# Patient Record
Sex: Female | Born: 1935 | Race: White | Hispanic: No | Smoking: Current every day smoker
Health system: Southern US, Community
[De-identification: ages and names within clinical notes are randomized; demographics above are authoritative.]

## PROBLEM LIST (undated history)

## (undated) DIAGNOSIS — C50919 Malignant neoplasm of unspecified site of unspecified female breast: Secondary | ICD-10-CM

## (undated) DIAGNOSIS — E785 Hyperlipidemia, unspecified: Secondary | ICD-10-CM

## (undated) DIAGNOSIS — K219 Gastro-esophageal reflux disease without esophagitis: Secondary | ICD-10-CM

## (undated) DIAGNOSIS — I10 Essential (primary) hypertension: Secondary | ICD-10-CM

## (undated) DIAGNOSIS — M810 Age-related osteoporosis without current pathological fracture: Secondary | ICD-10-CM

## (undated) DIAGNOSIS — I4891 Unspecified atrial fibrillation: Secondary | ICD-10-CM

## (undated) HISTORY — DX: Unspecified atrial fibrillation: I48.91

## (undated) HISTORY — DX: Essential (primary) hypertension: I10

## (undated) HISTORY — PX: BREAST LUMPECTOMY: SHX2

## (undated) HISTORY — DX: Hyperlipidemia, unspecified: E78.5

---

## 2004-11-24 ENCOUNTER — Ambulatory Visit: Payer: Self-pay | Admitting: Occupational Therapy

## 2005-12-20 ENCOUNTER — Ambulatory Visit: Payer: Self-pay | Admitting: Internal Medicine

## 2011-03-14 ENCOUNTER — Ambulatory Visit: Payer: Self-pay | Admitting: Oncology

## 2011-03-29 ENCOUNTER — Ambulatory Visit: Payer: Self-pay | Admitting: Oncology

## 2011-04-16 ENCOUNTER — Ambulatory Visit: Payer: Self-pay | Admitting: Oncology

## 2011-05-17 ENCOUNTER — Ambulatory Visit: Payer: Self-pay | Admitting: Oncology

## 2014-02-22 ENCOUNTER — Ambulatory Visit: Payer: Self-pay | Admitting: Family Medicine

## 2019-05-14 ENCOUNTER — Other Ambulatory Visit (HOSPITAL_COMMUNITY): Payer: Self-pay | Admitting: Internal Medicine

## 2019-05-14 DIAGNOSIS — N644 Mastodynia: Secondary | ICD-10-CM

## 2019-06-10 ENCOUNTER — Other Ambulatory Visit (HOSPITAL_COMMUNITY): Payer: Self-pay | Admitting: Internal Medicine

## 2019-06-15 ENCOUNTER — Other Ambulatory Visit (HOSPITAL_COMMUNITY): Payer: Self-pay | Admitting: Internal Medicine

## 2019-06-15 DIAGNOSIS — N644 Mastodynia: Secondary | ICD-10-CM

## 2019-06-23 ENCOUNTER — Ambulatory Visit (HOSPITAL_COMMUNITY)
Admission: RE | Admit: 2019-06-23 | Discharge: 2019-06-23 | Disposition: A | Discharge status: Home / Self Care | Payer: Medicare Other | Source: Ambulatory Visit | Attending: Internal Medicine | Admitting: Internal Medicine

## 2019-06-23 ENCOUNTER — Encounter (HOSPITAL_COMMUNITY): Payer: Self-pay

## 2019-06-23 ENCOUNTER — Ambulatory Visit (HOSPITAL_COMMUNITY): Payer: Medicare Other

## 2019-06-23 ENCOUNTER — Other Ambulatory Visit: Payer: Self-pay

## 2019-06-23 DIAGNOSIS — N644 Mastodynia: Secondary | ICD-10-CM | POA: Diagnosis not present

## 2019-09-14 HISTORY — PX: ESOPHAGOGASTRODUODENOSCOPY: SHX1529

## 2019-10-12 ENCOUNTER — Other Ambulatory Visit: Payer: Self-pay

## 2019-10-12 ENCOUNTER — Emergency Department (HOSPITAL_COMMUNITY): Payer: Medicare Other

## 2019-10-12 ENCOUNTER — Emergency Department (HOSPITAL_COMMUNITY)
Admission: EM | Admit: 2019-10-12 | Discharge: 2019-10-13 | Disposition: A | Discharge status: Skilled Nursing Facility | Payer: Medicare Other | Attending: Emergency Medicine | Admitting: Emergency Medicine

## 2019-10-12 ENCOUNTER — Encounter (HOSPITAL_COMMUNITY): Payer: Self-pay

## 2019-10-12 DIAGNOSIS — Z853 Personal history of malignant neoplasm of breast: Secondary | ICD-10-CM | POA: Diagnosis not present

## 2019-10-12 DIAGNOSIS — Z79899 Other long term (current) drug therapy: Secondary | ICD-10-CM | POA: Insufficient documentation

## 2019-10-12 DIAGNOSIS — E860 Dehydration: Secondary | ICD-10-CM | POA: Diagnosis not present

## 2019-10-12 DIAGNOSIS — K219 Gastro-esophageal reflux disease without esophagitis: Secondary | ICD-10-CM | POA: Diagnosis not present

## 2019-10-12 DIAGNOSIS — R630 Anorexia: Secondary | ICD-10-CM | POA: Diagnosis not present

## 2019-10-12 DIAGNOSIS — R531 Weakness: Secondary | ICD-10-CM | POA: Diagnosis present

## 2019-10-12 HISTORY — DX: Gastro-esophageal reflux disease without esophagitis: K21.9

## 2019-10-12 HISTORY — DX: Age-related osteoporosis without current pathological fracture: M81.0

## 2019-10-12 HISTORY — DX: Malignant neoplasm of unspecified site of unspecified female breast: C50.919

## 2019-10-12 LAB — CBC WITH DIFFERENTIAL/PLATELET
Abs Immature Granulocytes: 0.04 10*3/uL (ref 0.00–0.07)
Basophils Absolute: 0 10*3/uL (ref 0.0–0.1)
Basophils Relative: 0 %
Eosinophils Absolute: 0.1 10*3/uL (ref 0.0–0.5)
Eosinophils Relative: 1 %
HCT: 41.1 % (ref 36.0–46.0)
Hemoglobin: 12.2 g/dL (ref 12.0–15.0)
Immature Granulocytes: 0 %
Lymphocytes Relative: 19 %
Lymphs Abs: 1.8 10*3/uL (ref 0.7–4.0)
MCH: 32.5 pg (ref 26.0–34.0)
MCHC: 29.7 g/dL — ABNORMAL LOW (ref 30.0–36.0)
MCV: 109.6 fL — ABNORMAL HIGH (ref 80.0–100.0)
Monocytes Absolute: 0.8 10*3/uL (ref 0.1–1.0)
Monocytes Relative: 8 %
Neutro Abs: 6.7 10*3/uL (ref 1.7–7.7)
Neutrophils Relative %: 72 %
Platelets: 262 10*3/uL (ref 150–400)
RBC: 3.75 MIL/uL — ABNORMAL LOW (ref 3.87–5.11)
RDW: 13.2 % (ref 11.5–15.5)
WBC: 9.5 10*3/uL (ref 4.0–10.5)
nRBC: 0 % (ref 0.0–0.2)

## 2019-10-12 LAB — COMPREHENSIVE METABOLIC PANEL
ALT: 38 U/L (ref 0–44)
AST: 38 U/L (ref 15–41)
Albumin: 3.2 g/dL — ABNORMAL LOW (ref 3.5–5.0)
Alkaline Phosphatase: 74 U/L (ref 38–126)
Anion gap: 7 (ref 5–15)
BUN: 34 mg/dL — ABNORMAL HIGH (ref 8–23)
CO2: 29 mmol/L (ref 22–32)
Calcium: 9.2 mg/dL (ref 8.9–10.3)
Chloride: 108 mmol/L (ref 98–111)
Creatinine, Ser: 0.91 mg/dL (ref 0.44–1.00)
GFR calc Af Amer: >60 mL/min (ref >60)
GFR calc non Af Amer: 54 mL/min — ABNORMAL LOW (ref >60)
Glucose, Bld: 111 mg/dL — ABNORMAL HIGH (ref 70–99)
Potassium: 3.9 mmol/L (ref 3.5–5.1)
Sodium: 144 mmol/L (ref 135–145)
Total Bilirubin: 0.5 mg/dL (ref 0.3–1.2)
Total Protein: 6.7 g/dL (ref 6.5–8.1)

## 2019-10-12 MED ORDER — SODIUM CHLORIDE 0.9 % IV BOLUS
1000.0000 mL | Freq: Once | INTRAVENOUS | Status: AC
  Administered 2019-10-12: 1000 mL via INTRAVENOUS

## 2019-10-12 NOTE — ED Provider Notes (Signed)
[facility] EMERGENCY DEPARTMENT Provider Note   CSN: [place]:[id number] Arrival date & time: 10/12/19  1628     History Chief Complaint  Patient presents with  . Failure To Thrive    Gloria Davis is a 86 y.o. female.  Patient fell last week and was taken over to be seen at the [facility] emergency department.  She was discharged home.  Patient has continued to be weak and was sent here for recheck.  Patient stays at the assisted living and has not been walking since her fall but is alert and oriented to person and place  The history is provided by the patient and a relative. No language interpreter was used.  Weakness Severity:  Mild Onset quality:  Sudden Timing:  Constant Progression:  Waxing and waning Chronicity:  New Context: not alcohol use   Relieved by:  Nothing Worsened by:  Nothing Ineffective treatments:  None tried Associated symptoms: no abdominal pain, no chest pain, no cough, no diarrhea, no frequency, no headaches and no seizures        Past Medical History:  Diagnosis Date  . GERD (gastroesophageal reflux disease)   . Malignant neoplasm of breast (female) ([place])   . Osteoporosis     There are no problems to display for this patient.   Past Surgical History:  Procedure Laterality Date  . BREAST LUMPECTOMY       OB History   No obstetric history on file.     No family history on file.  Social History   Tobacco Use  . Smoking status: Not on file  Substance Use Topics  . Alcohol use: Not on file  . Drug use: Not on file    Home Medications Prior to Admission medications   Medication Sig Start Date End Date Taking? Authorizing Provider  acetaminophen (TYLENOL) 325 MG tablet Take 650 mg by mouth in the morning and at bedtime.   Yes [provider]  alum & mag hydroxide-simeth ([place]) 200-200-20 MG/5ML suspension Take by mouth every 6 (six) hours as needed for indigestion or heartburn.   Yes [provider]  amLODipine  (NORVASC) 5 MG tablet Take 5 mg by mouth daily.   Yes [provider]  apixaban (ELIQUIS) 2.5 MG TABS tablet Take 2.5 mg by mouth 2 (two) times daily.   Yes [provider]  aspirin EC 81 MG tablet Take 81 mg by mouth daily.   Yes [provider]  cholecalciferol (VITAMIN D3) 25 MCG (1000 UNIT) tablet Take 1,000 Units by mouth daily.   Yes [provider]  docusate sodium (COLACE) 100 MG capsule Take 100 mg by mouth 2 (two) times daily.   Yes [provider]  doxycycline (VIBRAMYCIN) 100 MG capsule Take 100 mg by mouth 2 (two) times daily.   Yes [provider]  ferrous sulfate 325 (65 FE) MG EC tablet Take 325 mg by mouth daily.   Yes [provider]  latanoprost (XALATAN) 0.005 % ophthalmic solution Place 1 drop into both eyes daily.   Yes [provider]  loperamide (IMODIUM A-D) 2 MG tablet Take 2 mg by mouth 4 (four) times daily as needed for diarrhea or loose stools.   Yes [provider]  magnesium hydroxide (MILK OF MAGNESIA) 400 MG/5ML suspension Take 30 mLs by mouth at bedtime as needed for mild constipation.   Yes [provider]  metoprolol succinate (TOPROL-XL) 50 MG 24 hr tablet Take 50 mg by mouth in the morning and  at bedtime. Take with or immediately following a meal.   Yes [provider]  ondansetron (ZOFRAN-ODT) 4 MG disintegrating tablet Take 4 mg by mouth every 6 (six) hours as needed for nausea or vomiting.   Yes [provider]  pantoprazole (PROTONIX) 40 MG tablet Take 40 mg by mouth 2 (two) times daily.   Yes [provider]  Polyethyl Glycol-Propyl Glycol (SYSTANE) 0.4-0.3 % GEL ophthalmic gel Place 1 application into both eyes in the morning, at noon, and at bedtime.   Yes [provider]  rosuvastatin (CRESTOR) 10 MG tablet Take 10 mg by mouth at bedtime.   Yes [provider]  senna-docusate (SENEXON-S) 8.6-50 MG tablet Take 1 tablet by  mouth 2 (two) times daily.   Yes [provider]    Allergies    Codeine, Lactose intolerance (gi), and Sulfa antibiotics  Review of Systems   Review of Systems  Constitutional: Negative for appetite change and fatigue.  HENT: Negative for congestion, ear discharge and sinus pressure.   Eyes: Negative for discharge.  Respiratory: Negative for cough.   Cardiovascular: Negative for chest pain.  Gastrointestinal: Negative for abdominal pain and diarrhea.  Genitourinary: Negative for frequency and hematuria.  Musculoskeletal: Negative for back pain.  Skin: Negative for rash.  Neurological: Positive for weakness. Negative for seizures and headaches.  Psychiatric/Behavioral: Negative for hallucinations.    Physical Exam Updated Vital Signs BP (!) 147/59   Pulse (!) 58   Temp 98.6 F (37 C) (Oral)   Resp 17   Ht 5' (1.524 m)   Wt 40.8 kg   SpO2 93%   BMI 17.58 kg/m   Physical Exam Vitals reviewed.  Constitutional:      Appearance: She is well-developed.  HENT:     Head: Normocephalic.     Comments: Mucous membranes dry    Nose: Nose normal.  Eyes:     General: No scleral icterus.    Conjunctiva/sclera: Conjunctivae normal.  Neck:     Thyroid: No thyromegaly.  Cardiovascular:     Rate and Rhythm: Normal rate and regular rhythm.     Heart sounds: No murmur. No friction rub. No gallop.   Pulmonary:     Breath sounds: No stridor. No wheezing or rales.  Chest:     Chest wall: No tenderness.  Abdominal:     General: There is no distension.     Tenderness: There is no abdominal tenderness. There is no rebound.  Musculoskeletal:        General: Normal range of motion.     Cervical back: Neck supple.  Lymphadenopathy:     Cervical: No cervical adenopathy.  Skin:    Findings: No erythema or rash.  Neurological:     Mental Status: She is alert and oriented to person, place, and time.     Motor: No abnormal muscle tone.     Coordination: Coordination normal.    Psychiatric:        Behavior: Behavior normal.     ED Results / Procedures / Treatments   Labs (all labs ordered are listed, but only abnormal results are displayed) Labs Reviewed  CBC WITH DIFFERENTIAL/PLATELET - Abnormal; Notable for the following components:      Result Value   RBC 3.75 (*)    MCV 109.6 (*)    MCHC 29.7 (*)    All other components within normal limits  COMPREHENSIVE METABOLIC PANEL - Abnormal; Notable for the following components:   Glucose, Bld 111 (*)  BUN 34 (*)    Albumin 3.2 (*)    GFR calc non Af Amer 54 (*)    All other components within normal limits  URINALYSIS, ROUTINE W REFLEX MICROSCOPIC    EKG EKG Interpretation  Date/Time:  Monday October 12 2019 17:56:58 EDT Ventricular Rate:  56 PR Interval:    QRS Duration: 82 QT Interval:  466 QTC Calculation: 450 R Axis:   -36 Text Interpretation: Sinus rhythm Left axis deviation Anteroseptal infarct, age indeterminate Confirmed by Milton Ferguson [id number]) on 10/12/2019 11:31:40 PM   Radiology DG Chest 2 View  Result Date: 10/12/2019 CLINICAL DATA:  Low-grade fever, anorexia EXAM: CHEST - 2 VIEW COMPARISON:  None. FINDINGS: Frontal and lateral views of the chest demonstrate an unremarkable cardiac silhouette. Diffuse interstitial prominence favor scarring. No airspace disease, effusion, or pneumothorax. Multiple compression deformities throughout the lower thoracic and lumbar spine, with multilevel vertebral augmentation and posterior fusion. IMPRESSION: 1. No acute intrathoracic process. Electronically Signed   By: Randa Ngo M.D.   On: 10/12/2019 19:02   CT Head Wo Contrast  Result Date: 10/12/2019 CLINICAL DATA:  Ataxia, anorexia for 1 week, low-grade fever EXAM: CT HEAD WITHOUT CONTRAST TECHNIQUE: Contiguous axial images were obtained from the base of the skull through the vertex without intravenous contrast. COMPARISON:  None. FINDINGS: Brain: There are confluent hypodensities within the  periventricular white matter consistent with age-indeterminate small vessel ischemic changes. There is diffuse cerebral atrophy, age-appropriate. No signs of acute infarct or hemorrhage. Lateral ventricles and midline structures are unremarkable. No acute extra-axial fluid collections. No mass effect. Vascular: No hyperdense vessel or unexpected calcification. Skull: Normal. Negative for fracture or focal lesion. Sinuses/Orbits: No acute finding. Other: None IMPRESSION: 1. Age indeterminate small-vessel ischemic changes throughout the periventricular white matter, favor chronic. 2. No acute intracranial process. Electronically Signed   By: Randa Ngo M.D.   On: 10/12/2019 19:01    Procedures Procedures (including critical care time)  Medications Ordered in ED Medications  sodium chloride 0.9 % bolus 1,000 mL (0 mLs Intravenous Stopped 10/12/19 2321)    ED Course  I have reviewed the triage vital signs and the nursing notes.  Pertinent labs & imaging results that were available during my care of the patient were reviewed by me and considered in my medical decision making (see chart for details).    MDM Rules/Calculators/A&P                      Labs show patient to be dehydrated, chest x-ray and CT unremarkable.  Patient was given 1 L of fluids and was able to urinate for [place]. Final Clinical Impression(s) / ED Diagnoses Final diagnoses:  None    Rx / DC Orders ED Discharge Orders    None       Milton Ferguson, MD 10/13/19 2308

## 2019-10-12 NOTE — Discharge Instructions (Addendum)
Drink more fluids and follow-up with your family doctor later this week for recheck.  Return to the ED if you develop new or worsening symptoms.

## 2019-10-12 NOTE — ED Triage Notes (Signed)
Pt brought to ED via [facility]. Pt recently transported on 3/25 to [facility] for same complaints and sent here for second opinion per EMS. Per [facility], pt has had low grade fever, not eating x 1 week.

## 2019-10-12 NOTE — ED Notes (Signed)
Unsuccessful in and out x 2.

## 2019-10-13 DIAGNOSIS — E860 Dehydration: Secondary | ICD-10-CM | POA: Diagnosis not present

## 2019-10-13 LAB — URINALYSIS, ROUTINE W REFLEX MICROSCOPIC
Bilirubin Urine: NEGATIVE
Glucose, UA: NEGATIVE mg/dL
Hgb urine dipstick: NEGATIVE
Ketones, ur: NEGATIVE mg/dL
Nitrite: NEGATIVE
Protein, ur: NEGATIVE mg/dL
Specific Gravity, Urine: 1.02 (ref 1.005–1.030)
pH: 5 (ref 5.0–8.0)

## 2019-10-13 NOTE — ED Provider Notes (Signed)
Care assumed from Dr. Roderic Palau pending urinalysis.  Patient with generalized weakness and anorexia.  Denies any pain complaints.  She received IV fluids.  Urinalysis is not overtly infected.  Will send culture.  Patient is able to ambulate which is her baseline with assistance. Discussed with patient and daughter at bedside. Patient denies complaints and is anxious to go home. Follow-up with PCP.  Return precautions discussed.  BP (!) 144/68   Pulse (!) 59   Temp 98.6 F (37 C) (Oral)   Resp 15   Ht 5' (1.524 m)   Wt 40.8 kg   SpO2 96%   BMI 17.58 kg/m     Ezequiel Essex, MD 10/13/19 0345

## 2019-10-13 NOTE — ED Notes (Signed)
Pt being d/c'd into niece's car to go POV to [facility]. This RN spoke with [facility] who stated that it was okay for pt to be transported back by niece.

## 2019-10-14 LAB — URINE CULTURE: Culture: NO GROWTH

## 2019-10-28 ENCOUNTER — Other Ambulatory Visit: Payer: Self-pay

## 2019-10-28 ENCOUNTER — Ambulatory Visit (INDEPENDENT_AMBULATORY_CARE_PROVIDER_SITE_OTHER): Payer: Medicare Other | Admitting: Gastroenterology

## 2019-10-28 ENCOUNTER — Encounter: Payer: Self-pay | Admitting: Gastroenterology

## 2019-10-28 DIAGNOSIS — K2101 Gastro-esophageal reflux disease with esophagitis, with bleeding: Secondary | ICD-10-CM

## 2019-10-28 DIAGNOSIS — R1310 Dysphagia, unspecified: Secondary | ICD-10-CM | POA: Diagnosis not present

## 2019-10-28 DIAGNOSIS — K219 Gastro-esophageal reflux disease without esophagitis: Secondary | ICD-10-CM | POA: Insufficient documentation

## 2019-10-28 NOTE — Progress Notes (Addendum)
Primary Care Physician:  Abran Richard, MD  Primary Gastroenterologist:  Garfield Cornea, MD   Chief Complaint  Patient presents with  . Gastroesophageal Reflux    esophagitis  . Dysphagia    anything she has to swallow    HPI:  Gloria Davis is a 86 y.o. female here at the request of Abran Richard for further evaluation of esophagitis, dysphagia. Patient resides at [facility]. Presents with the Financial risk analyst today who is helping with transportation. She is really unable to provide additional information about the patient. Reportedly in hospital recently in [place] and noted to have esophagitis on endoscopy and CT scan.   Per patient, she has been having trouble swallowing for quite some time. Chews thoroughly. Last one to finish eating. No significant weight loss reported. It is not clear if her swallowing issues are significant. She says her stomach feels tight with eating. No heartburn or vomiting. She says her stools were "black as tar" and runny before, but now lighter and formed. No black stools in several weeks. No constipation, diarrhea.    Current Outpatient Medications  Medication Sig Dispense Refill  . acetaminophen (TYLENOL) 325 MG tablet Take 325 mg by mouth in the morning and at bedtime.     Marland Kitchen acetaminophen (TYLENOL) 500 MG tablet Take 500 mg by mouth every 4 (four) hours as needed.    Marland Kitchen alum & mag hydroxide-simeth ([place]) 200-200-20 MG/5ML suspension Take 30 mLs by mouth as needed for indigestion or heartburn.     Marland Kitchen amLODipine (NORVASC) 5 MG tablet Take 5 mg by mouth daily.    Marland Kitchen apixaban (ELIQUIS) 2.5 MG TABS tablet Take 2.5 mg by mouth 2 (two) times daily.    Marland Kitchen aspirin EC 81 MG tablet Take 81 mg by mouth daily.    . cholecalciferol (VITAMIN D3) 25 MCG (1000 UNIT) tablet Take 1,000 Units by mouth daily.    Marland Kitchen docusate sodium (COLACE) 100 MG capsule Take 100 mg by mouth 2 (two) times daily.    Marland Kitchen doxycycline (VIBRAMYCIN) 100 MG capsule Take 100 mg by mouth 2 (two)  times daily.    . ferrous sulfate 325 (65 FE) MG EC tablet Take 325 mg by mouth daily.    . food thickener (THICK IT) POWD Take by mouth in the morning and at bedtime.    Marland Kitchen guaifenesin (ROBITUSSIN) 100 MG/5ML syrup Take 10 mLs by mouth every 6 (six) hours as needed for cough.    . latanoprost (XALATAN) 0.005 % ophthalmic solution Place 1 drop into both eyes daily.    Marland Kitchen loperamide (IMODIUM A-D) 2 MG tablet Take 2 mg by mouth 4 (four) times daily as needed for diarrhea or loose stools.    . magnesium hydroxide (MILK OF MAGNESIA) 400 MG/5ML suspension Take 30 mLs by mouth at bedtime as needed for mild constipation.    . metoprolol succinate (TOPROL-XL) 50 MG 24 hr tablet Take 50 mg by mouth in the morning and at bedtime. Take with or immediately following a meal.    . neomycin-bacitracin-polymyxin (NEOSPORIN) [phone] ointment Apply topically as needed.    . ondansetron (ZOFRAN-ODT) 4 MG disintegrating tablet Take 4 mg by mouth every 6 (six) hours as needed for nausea or vomiting.    . pantoprazole (PROTONIX) 40 MG tablet Take 40 mg by mouth 2 (two) times daily.    Vladimir Faster Glycol-Propyl Glycol (SYSTANE) 0.4-0.3 % GEL ophthalmic gel Place 1 application into both eyes 2 (two) times daily as needed.     Marland Kitchen  rosuvastatin (CRESTOR) 10 MG tablet Take 10 mg by mouth at bedtime.    . senna-docusate (SENEXON-S) 8.6-50 MG tablet Take 2 tablets by mouth at bedtime.      No current facility-administered medications for this visit.    Allergies as of 10/28/2019 - Review Complete 10/28/2019  Allergen Reaction Noted  . Codeine  10/12/2019  . Lactose intolerance (gi)  10/12/2019  . Sulfa antibiotics  10/12/2019    Past Medical History:  Diagnosis Date  . A-fib ([place])   . GERD (gastroesophageal reflux disease)   . HTN (hypertension)   . Hyperlipidemia   . Malignant neoplasm of breast (female) ([place])   . Osteoporosis     Past Surgical History:  Procedure Laterality Date  . BREAST LUMPECTOMY    .  ESOPHAGOGASTRODUODENOSCOPY  09/2019   Dr. Posey Pronto: moderate erythema stomach body and antrum, 4cm hh, moderate to severe friability and nodularity with small clots in the GEJ and lower 3rd of esophagus, ulcers milddle to lower third of esophagus. extrinsix deformity duodenal bulb unable to advance scopt to second part ?prior PUD. Bx chronic gastritis, ulcerative reflux esophagitis. no h.pylori or barretts    History reviewed. No pertinent family history.  Social History   Socioeconomic History  . Marital status: Widowed    Spouse name: Not on file  . Number of children: Not on file  . Years of education: Not on file  . Highest education level: Not on file  Occupational History  . Not on file  Tobacco Use  . Smoking status: Current Every Day Smoker    Types: Cigarettes  . Smokeless tobacco: Never Used  Substance and Sexual Activity  . Alcohol use: Never  . Drug use: Never  . Sexual activity: Not on file  Other Topics Concern  . Not on file  Social History Narrative  . Not on file   Social Determinants of Health   Financial Resource Strain:   . Difficulty of Paying Living Expenses:   Food Insecurity:   . Worried About Charity fundraiser in the Last Year:   . Arboriculturist in the Last Year:   Transportation Needs:   . Film/video editor (Medical):   Marland Kitchen Lack of Transportation (Non-Medical):   Physical Activity:   . Days of Exercise per Week:   . Minutes of Exercise per Session:   Stress:   . Feeling of Stress :   Social Connections:   . Frequency of Communication with Friends and Family:   . Frequency of Social Gatherings with Friends and Family:   . Attends Religious Services:   . Active Member of Clubs or Organizations:   . Attends Archivist Meetings:   Marland Kitchen Marital Status:   Intimate Partner Violence:   . Fear of Current or Ex-Partner:   . Emotionally Abused:   Marland Kitchen Physically Abused:   . Sexually Abused:       ROS: limited information by  patient  General: Negative for anorexia, weight loss, fever, chills, fatigue, weakness. Eyes: Negative for vision changes.  ENT: Negative for hoarseness,  nasal congestion.see hpi CV: Negative for chest pain, angina, palpitations, dyspnea on exertion, peripheral edema.  Respiratory: Negative for dyspnea at rest, dyspnea on exertion, cough, sputum, wheezing.  GI: See history of present illness. GU:  Negative for dysuria, hematuria, urinary incontinence, urinary frequency, nocturnal urination.  MS: Negative for joint pain, low back pain.  Derm: Negative for rash or itching.  Neuro: Negative for weakness, abnormal sensation,  seizure, frequent headaches, memory loss, confusion.  Psych: Negative for anxiety, depression, suicidal ideation, hallucinations.  Endo: Negative for unusual weight change.  Heme: Negative for bruising or bleeding. Allergy: Negative for rash or hives.    Physical Examination:  BP 137/67   Pulse (!) 52   Temp (!) 97.3 F (36.3 C) (Oral)   Wt 96 lb 6.4 oz (43.7 kg)   BMI 18.83 kg/m    General: Well-nourished, well-developed in no acute distress.  Head: Normocephalic, atraumatic.   Eyes: Conjunctiva pink, no icterus. Mouth: masked Neck: Supple without thyromegaly, masses, or lymphadenopathy.  Lungs: Clear to auscultation bilaterally.  Heart: Regular rate and rhythm, no murmurs rubs or gallops.  Abdomen: Bowel sounds are normal, nontender, nondistended, no hepatosplenomegaly or masses, no abdominal bruits or    hernia , no rebound or guarding.   Rectal: not performed Extremities: No lower extremity edema. No clubbing or deformities.  Neuro: Alert and oriented x 4 , grossly normal neurologically.  Skin: Warm and dry, no rash or jaundice.   Psych: Alert and cooperative, normal mood and affect.  Lab Results  Component Value Date   WBC 9.5 10/12/2019   HGB 12.2 10/12/2019   HCT 41.1 10/12/2019   MCV 109.6 (H) 10/12/2019   PLT 262 10/12/2019   Lab Results   Component Value Date   CREATININE 0.91 10/12/2019   BUN 34 (H) 10/12/2019   NA 144 10/12/2019   K 3.9 10/12/2019   CL 108 10/12/2019   CO2 29 10/12/2019   Lab Results  Component Value Date   ALT 38 10/12/2019   AST 38 10/12/2019   ALKPHOS 74 10/12/2019   BILITOT 0.5 10/12/2019    Imaging Studies: DG Chest 2 View  Result Date: 10/12/2019 CLINICAL DATA:  Low-grade fever, anorexia EXAM: CHEST - 2 VIEW COMPARISON:  None. FINDINGS: Frontal and lateral views of the chest demonstrate an unremarkable cardiac silhouette. Diffuse interstitial prominence favor scarring. No airspace disease, effusion, or pneumothorax. Multiple compression deformities throughout the lower thoracic and lumbar spine, with multilevel vertebral augmentation and posterior fusion. IMPRESSION: 1. No acute intrathoracic process. Electronically Signed   By: Randa Ngo M.D.   On: 10/12/2019 19:02   CT Head Wo Contrast  Result Date: 10/12/2019 CLINICAL DATA:  Ataxia, anorexia for 1 week, low-grade fever EXAM: CT HEAD WITHOUT CONTRAST TECHNIQUE: Contiguous axial images were obtained from the base of the skull through the vertex without intravenous contrast. COMPARISON:  None. FINDINGS: Brain: There are confluent hypodensities within the periventricular white matter consistent with age-indeterminate small vessel ischemic changes. There is diffuse cerebral atrophy, age-appropriate. No signs of acute infarct or hemorrhage. Lateral ventricles and midline structures are unremarkable. No acute extra-axial fluid collections. No mass effect. Vascular: No hyperdense vessel or unexpected calcification. Skull: Normal. Negative for fracture or focal lesion. Sinuses/Orbits: No acute finding. Other: None IMPRESSION: 1. Age indeterminate small-vessel ischemic changes throughout the periventricular white matter, favor chronic. 2. No acute intracranial process. Electronically Signed   By: Randa Ngo M.D.   On: 10/12/2019 19:01

## 2019-10-28 NOTE — Patient Instructions (Signed)
1. We are working on getting your records from your upper endoscopy and CT scan done while you were in the hospital. Once reviewed, we will provide additional recommendations.

## 2019-11-03 ENCOUNTER — Encounter: Payer: Self-pay | Admitting: Gastroenterology

## 2019-11-03 NOTE — Assessment & Plan Note (Signed)
86 y/o female presenting from [facility] for further evaluation of dysphagia and esophagitis. Reportedly had EGD and CT scan during admission in [place] recently. Records unavailable. Patient unable to provide significant history. Reports feeling full with meals and taking long time to chew food and eat.   Will obtain records and then touch base with [facility] to get more information regarding any questions or concerns they have regarding eating etc.

## 2019-11-04 ENCOUNTER — Telehealth: Payer: Self-pay | Admitting: Gastroenterology

## 2019-11-04 ENCOUNTER — Encounter: Payer: Self-pay | Admitting: Gastroenterology

## 2019-11-04 NOTE — Telephone Encounter (Signed)
Left a message for pts nurse. Pts nurse has left for the day and will call back tomorrow 11/05/19.

## 2019-11-04 NOTE — Telephone Encounter (Signed)
Please contact [place] and speak to someone who can provide information about the patient. She presented last week for new patient evaluation for esophagitis and dysphagia.   I have looked at records from her hospitalization at [facility].   EGD showed severe ulcerative/erosive reflux esophagitis, gastritis, large hh, abnormal duodenal bulb (could not advise scope).   Prior CT A/P with no acute findings or signs of malignancy.  WE NEED TO HAVE CBC DRAWN ON PATIENT  WE NEED TO KNOW IF SHE IS HAVING ANY ISSUES WITH HER MEALS, SWALLOWING, HEARTBURN/CHEST PAIN.  CONTINUE PANTOPRAZOLE 40MG  BID.   DOES SHE HAVE SPECIAL DIET OR HAVE TO THICKEN LIQUIDS?

## 2019-11-04 NOTE — Progress Notes (Signed)
Received records from [facility]. EGD findings and path updated under [facility].   She had new onset afib in setting of pna, started on Eliquis.  10/08/19: H/H 11.6/38, platelets 257000, INR 1.5, Cre 0.85,   CT chest abd pelvis with contrast 09/2019: no acute findings.   Per D/C summary from 10/14/19: CT showed esophageal wall thickening, no PE but showed reticulonodular densities of the right middle lobe and lingular likely infectious or inflammatory.

## 2019-11-09 NOTE — Telephone Encounter (Signed)
Spoke with the receptionist. She will have the pt's nurse call back this morning.

## 2019-11-09 NOTE — Telephone Encounter (Signed)
Spoke with pts nurse. Order for CBC needed and continuation of Pantoprazole 40 mg was faxed to home attn Otila Kluver at [phone]. Pt doesn't have any c/o swallowing issues, heartburn ect. Pt is on a mechanical soft diet with thickened liquids. Otila Kluver will fax CBC results back to our office when completed.

## 2019-11-17 NOTE — Telephone Encounter (Signed)
Noted. Thanks.  CBC from 11/12/19: H/H 10.8/33.3, MCV 102.2. Back in 09/2019 H/H was 11.6/38.  1. Needs to continue pantoprazole BID. 2. Repeat CBC, ferritin in 6 weeks. 3. OV in 3 months. 4. Can we find the CT Abd report from [facility] I sent to scan week of April 21? I don't see if chart. If we need to can we request again.

## 2019-11-17 NOTE — Telephone Encounter (Signed)
Spoke with pts nurse Delois, orders were given verbally over the phone. Orders were written on a script and faxed over to home @ [phone]. Pt was scheduled for 02/17/20 @ 10:00 AM with LSL.   Manuela Schwartz- can you locate CT report from [facility]? LSL Anson Crofts will return next week.

## 2019-11-30 ENCOUNTER — Other Ambulatory Visit: Payer: Self-pay

## 2019-11-30 NOTE — Progress Notes (Signed)
Opened in error

## 2019-11-30 NOTE — Telephone Encounter (Signed)
OV made and I sent another request to [facility] for CT of abdomen.

## 2019-11-30 NOTE — Telephone Encounter (Signed)
Noted  

## 2020-02-04 ENCOUNTER — Telehealth: Payer: Self-pay | Admitting: Gastroenterology

## 2020-02-04 NOTE — Telephone Encounter (Signed)
Labs dated 12/24/19: WBC 6700, H/H 12/37.1, MCV 102.8, Platelet 150000, ferritin 100  Has upcoming ov.

## 2020-02-17 ENCOUNTER — Encounter: Payer: Self-pay | Admitting: Gastroenterology

## 2020-02-17 ENCOUNTER — Ambulatory Visit (INDEPENDENT_AMBULATORY_CARE_PROVIDER_SITE_OTHER): Payer: Medicare Other | Admitting: Gastroenterology

## 2020-02-17 ENCOUNTER — Other Ambulatory Visit: Payer: Self-pay

## 2020-02-17 ENCOUNTER — Ambulatory Visit (HOSPITAL_COMMUNITY)
Admission: RE | Admit: 2020-02-17 | Discharge: 2020-02-17 | Disposition: A | Discharge status: Home / Self Care | Payer: Medicare Other | Source: Ambulatory Visit | Attending: Gastroenterology | Admitting: Gastroenterology

## 2020-02-17 VITALS — BP 166/73 | HR 62 | Temp 96.9°F | Ht 59.0 in | Wt 102.0 lb

## 2020-02-17 DIAGNOSIS — R1310 Dysphagia, unspecified: Secondary | ICD-10-CM | POA: Diagnosis not present

## 2020-02-17 DIAGNOSIS — M7989 Other specified soft tissue disorders: Secondary | ICD-10-CM | POA: Diagnosis not present

## 2020-02-17 DIAGNOSIS — M79605 Pain in left leg: Secondary | ICD-10-CM

## 2020-02-17 DIAGNOSIS — K2100 Gastro-esophageal reflux disease with esophagitis, without bleeding: Secondary | ICD-10-CM | POA: Diagnosis not present

## 2020-02-17 NOTE — Patient Instructions (Signed)
1. Ultrasound of your left leg to rule out blood clot today.  2. We will decide regarding further follow up of swallowing concerns and severe esophagitis after ultrasound results are back.

## 2020-02-17 NOTE — Progress Notes (Signed)
Primary Care Physician: Abran Richard, MD  Primary Gastroenterologist:  Garfield Cornea, MD   Chief Complaint  Patient presents with  . Gastroesophageal Reflux    still have trouble swallowing foods, chews thoroughly, eats smal amounts at time    HPI: Gloria Davis is a 86 y.o. female here for follow-up.  Last seen in April 2021.  History of CT documented esophagitis, dysphagia.  Resides at [facility].  When she was seen back in April she presented with activities director and she was unable to provide any additional information about the patient.  Patient reported trouble swallowing for quite some time.  Last one to finish eating.  No significant weight loss reported.  Stomach feels tight after eating.  She reported stools being black as tar but none in the past several weeks.   We were able to touch base with the patient's nurse who reported no swallowing issues, heartburn. Patient is on mechanical soft diet with thickened liquids.  We obtained records from her hospitalization at [facility] back in March.  EGD showed severe ulcerative/erosive reflux esophagitis, gastritis, large hiatal hernia, abnormal duodenal bulb (could not advance the scope).  CT abdomen pelvis/chest performed.  No acute findings or signs of malignancy.   Labs from June 2021 with normal hemoglobin of 12, hematocrit 37.1, MCV 102.8.  Ferritin 100.  No longer on Eliquis.  Patient reports that she is always the last 1 to finish eating at mealtime.  Denies abdominal pain.  Reports bowel movements are normal.  Denies blood in the stool or melena.  She is worried about the swelling in her left leg/calf area.  Complains because it is hard and somewhat tender.  States has been present for a couple of weeks.  Current Outpatient Medications  Medication Sig Dispense Refill  . acetaminophen (TYLENOL) 325 MG tablet Take 325 mg by mouth in the morning and at bedtime.     Marland Kitchen alum & mag hydroxide-simeth (MINTOX)  200-200-20 MG/5ML suspension Take 30 mLs by mouth as needed for indigestion or heartburn.     Marland Kitchen amLODipine (NORVASC) 5 MG tablet Take 5 mg by mouth daily.     Marland Kitchen aspirin EC 81 MG tablet Take 81 mg by mouth daily.    Marland Kitchen atorvastatin (LIPITOR) 20 MG tablet Take 20 mg by mouth daily.    . cholecalciferol (VITAMIN D3) 25 MCG (1000 UNIT) tablet Take 1,000 Units by mouth daily.    Marland Kitchen docusate sodium (COLACE) 100 MG capsule Take 100 mg by mouth 2 (two) times daily.    . ferrous sulfate 325 (65 FE) MG EC tablet Take 325 mg by mouth daily.    . food thickener (THICK IT) POWD Take by mouth in the morning and at bedtime.    Marland Kitchen guaifenesin (ROBITUSSIN) 100 MG/5ML syrup Take 10 mLs by mouth every 6 (six) hours as needed for cough.    . latanoprost (XALATAN) 0.005 % ophthalmic solution Place 1 drop into both eyes daily.    Marland Kitchen loperamide (IMODIUM A-D) 2 MG tablet Take 2 mg by mouth 4 (four) times daily as needed for diarrhea or loose stools.    . magnesium hydroxide (MILK OF MAGNESIA) 400 MG/5ML suspension Take 30 mLs by mouth at bedtime as needed for mild constipation.    . metoprolol succinate (TOPROL-XL) 50 MG 24 hr tablet Take 50 mg by mouth in the morning and at bedtime. Take with or immediately following a meal.    . neomycin-bacitracin-polymyxin (NEOSPORIN)  [phone] ointment Apply topically as needed.    . ondansetron (ZOFRAN-ODT) 4 MG disintegrating tablet Take 4 mg by mouth every 6 (six) hours as needed for nausea or vomiting.    . pantoprazole (PROTONIX) 40 MG tablet Take 40 mg by mouth 2 (two) times daily.    Vladimir Faster Glycol-Propyl Glycol (SYSTANE) 0.4-0.3 % GEL ophthalmic gel Place 1 application into both eyes 2 (two) times daily as needed.     Marland Kitchen acetaminophen (TYLENOL) 500 MG tablet Take 500 mg by mouth every 4 (four) hours as needed.     No current facility-administered medications for this visit.    Allergies as of 02/17/2020 - Review Complete 02/17/2020  Allergen Reaction Noted  . Codeine   10/12/2019  . Lactose intolerance (gi)  10/12/2019  . Sulfa antibiotics  10/12/2019    ROS:  General: Negative for anorexia, weight loss, fever, chills, fatigue, weakness. ENT: Negative for hoarseness, difficulty swallowing , nasal congestion. CV: Negative for chest pain, angina, palpitations, dyspnea on exertion, positive peripheral edema.  Respiratory: Negative for dyspnea at rest, dyspnea on exertion, cough, sputum, wheezing.  GI: See history of present illness. GU:  Negative for dysuria, hematuria, urinary incontinence, urinary frequency, nocturnal urination.  Endo: Negative for unusual weight change.    Physical Examination:   BP (!) 166/73   Pulse 62   Temp (!) 96.9 F (36.1 C)   Ht 4\' 11"  (1.499 m)   Wt 102 lb (46.3 kg)   BMI 20.60 kg/m   General: Well-nourished, well-developed in no acute distress.  Accompanied by activities director from the group home. Eyes: No icterus. Mouth: Oropharyngeal mucosa moist and pink , no lesions erythema or exudate. Lungs: Clear to auscultation bilaterally.  Heart: Regular rate and rhythm, no murmurs rubs or gallops.  Abdomen: Bowel sounds are normal, nontender, nondistended, no hepatosplenomegaly or masses, no abdominal bruits or hernia , no rebound or guarding.   Extremities: 1+ edema in both feet.  1+ edema in the calf area only on the left leg somewhat hardened compared to the right, no erythema mild tenderness. No clubbing. Neuro: Alert and oriented x 4   Skin: Warm and dry, no jaundice.   Psych: Alert and cooperative, normal mood and affect.  Labs:  See HPI  Imaging Studies: No results found.  Impression/plan  Pleasant 86 year old female resident of the [facility] presenting for follow-up of esophagitis, dysphagia.  EGD in March 2021 at an outside facility showed severe ulcerative/erosive reflux esophagitis, gastritis, large hiatal hernia and abnormal duodenal bulb, scope could not be at advanced past this point.  CT  abdomen pelvis with no acute findings or signs of malignancy.  No reported concerns by the nursing home staff.  Weight is up since last office visit.  Patient's biggest concern is left lower extremity edema for several weeks.  Obvious difference from the right lower extremity, need venous ultrasound rule out DVT.  Patient continues to complain of some vague dysphagia.  May consider further imaging of the upper GI tract to follow-up on previous EGD findings.  Continue PPI therapy for now.  Further recommendations to follow.

## 2020-03-10 ENCOUNTER — Telehealth: Payer: Self-pay | Admitting: Gastroenterology

## 2020-03-10 NOTE — Telephone Encounter (Signed)
Please let caregiver know that I discussed patient with Dr. Gala Romney, no need for repeat upper endoscopy or UGI series as long as the patient does not appear to be having any swallowing issues or abdominal pain, vomiting, weight loss. Would follow her clinically given her age of 53. Follow up prn with [place].  Continue pantoprazole.

## 2020-03-10 NOTE — Telephone Encounter (Signed)
Called facility and was asked to call back in 30 mins. Will call back.

## 2020-03-15 NOTE — Telephone Encounter (Signed)
Lmom for the case manager. Waiting on a return call.

## 2020-03-16 NOTE — Telephone Encounter (Signed)
Spoke with pts nurse. She was notified of Neil Crouch, PA and Dr. Roseanne Kaufman recommendations. Pts nurse will pass this information along to pts case manager and if they have questions, they will call back.

## 2020-03-17 NOTE — Telephone Encounter (Signed)
Spoke with Alwyn Ren from the nursing facility. She was given the recommendations of Neil Crouch, PA and Dr. Gala Romney. Pt will follow up as needed.

## 2020-09-20 IMAGING — DX DG CHEST 2V
2 series · 2 of 2 positions shown · non-contrast
Comparison: None.

CLINICAL DATA: Low-grade fever, anorexia

EXAM:
CHEST - 2 VIEW

[chest lat]
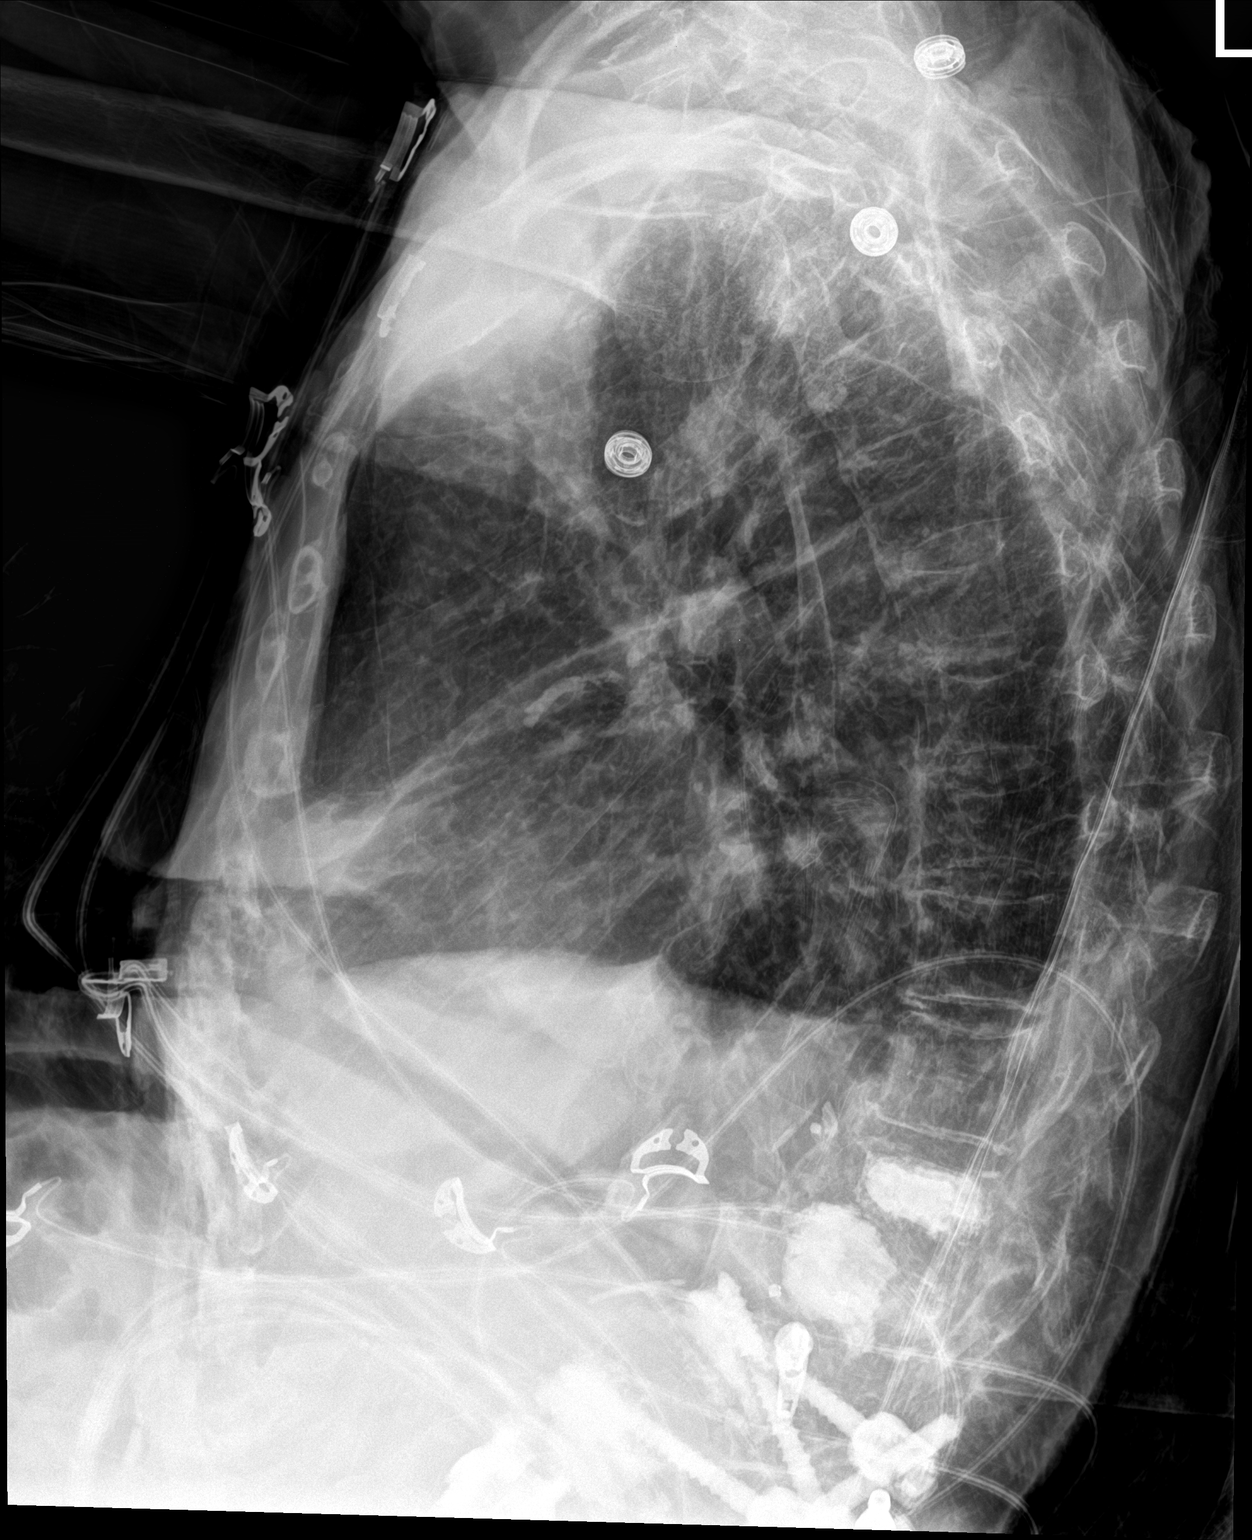

[chest ap]
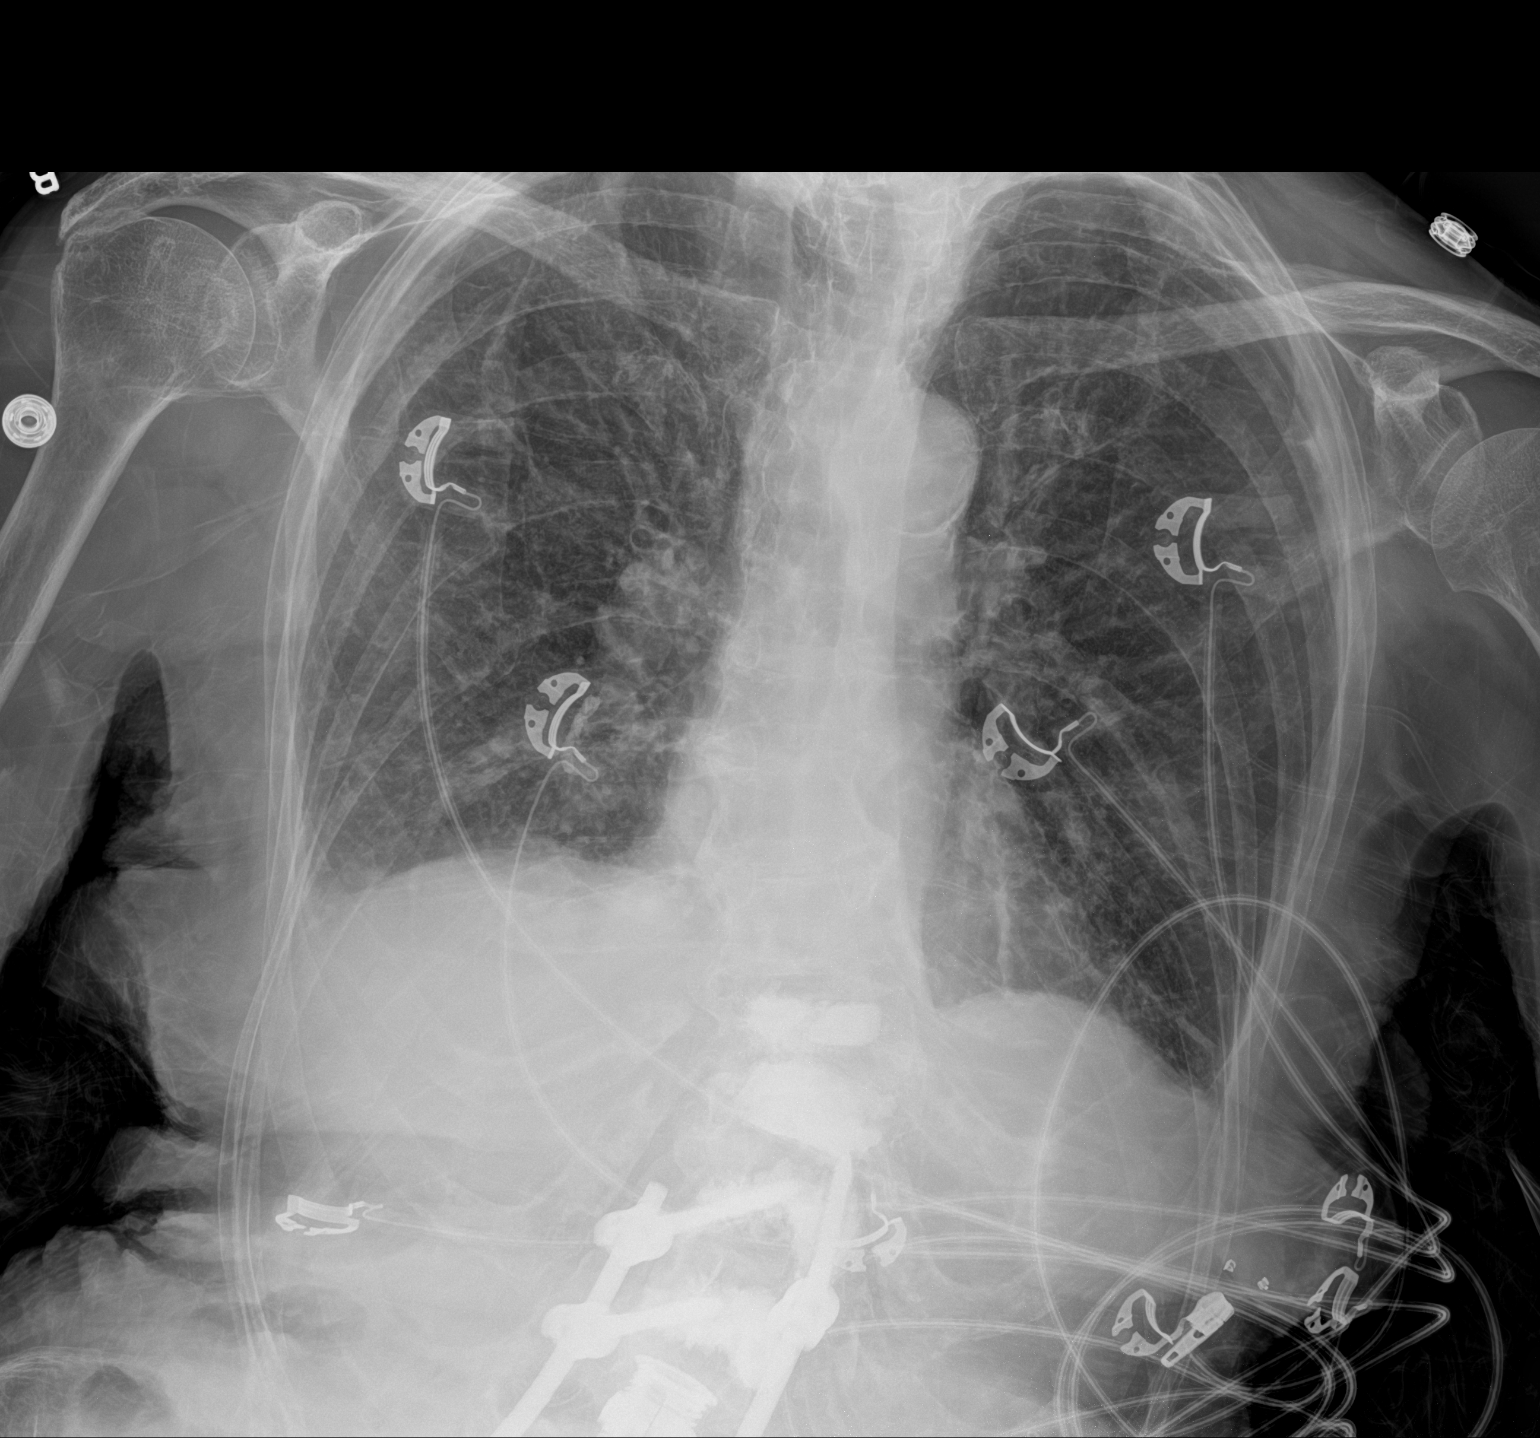

[2 of 2 positions shown; findings below may reference images not displayed]

FINDINGS: Frontal and lateral views of the chest demonstrate an unremarkable
cardiac silhouette. Diffuse interstitial prominence favor scarring.
No airspace disease, effusion, or pneumothorax. Multiple compression
deformities throughout the lower thoracic and lumbar spine, with
multilevel vertebral augmentation and posterior fusion.
IMPRESSION: 1. No acute intrathoracic process.

## 2020-09-20 IMAGING — CT CT HEAD W/O CM
3 series · 16 of 47 positions shown, 19 images · non-contrast
Comparison: None.

CLINICAL DATA: Ataxia, anorexia for 1 week, low-grade fever

EXAM:
CT HEAD WITHOUT CONTRAST
TECHNIQUE: Contiguous axial images were obtained from the base of the skull
through the vertex without intravenous contrast.

[Series 2: head w o · axial · 0.41mm/px · z∈[-25,+100]mm · 10 of 30 slices shown, 13 images]
[im 3/30  brain]
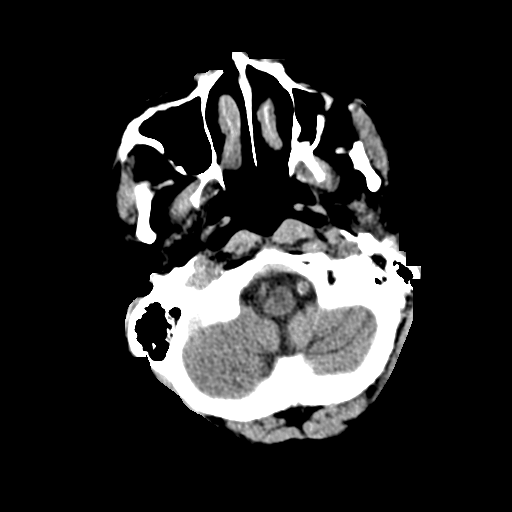
[im 3/30  bone]
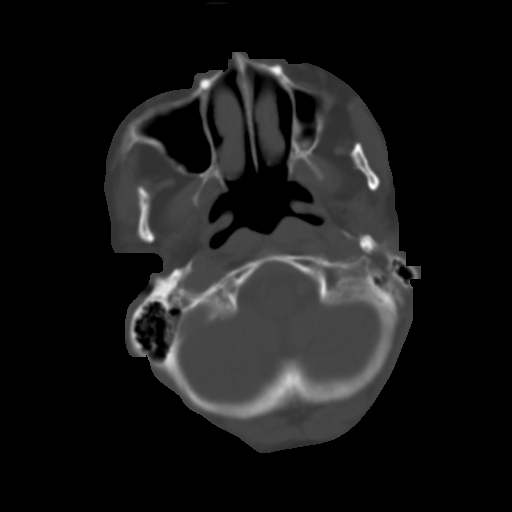
[im 6/30  brain]
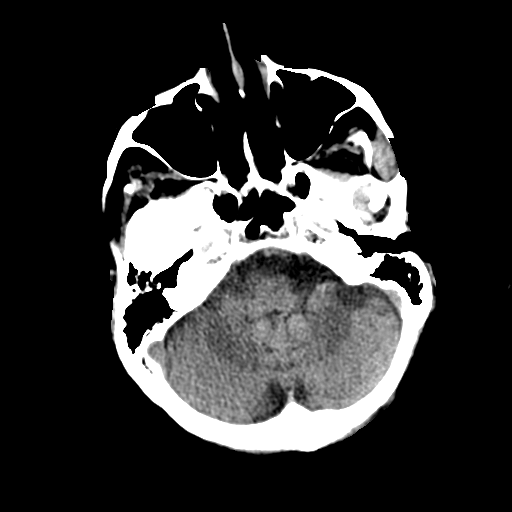
[im 9/30  brain]
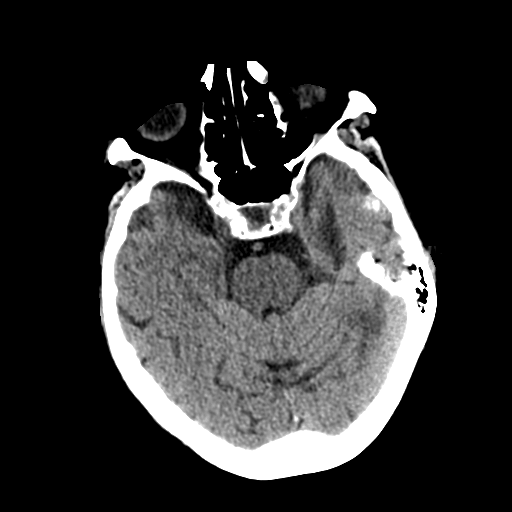
[im 11/30  brain]
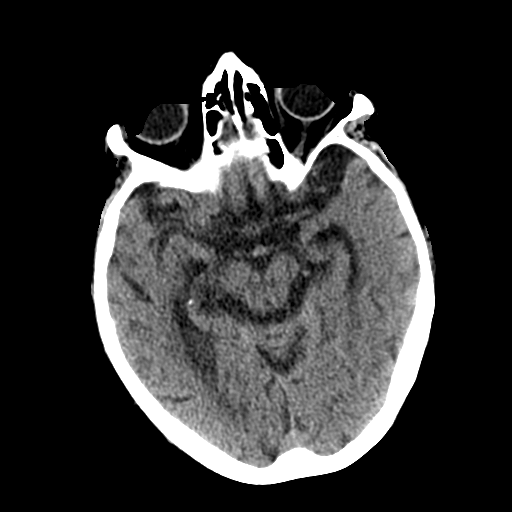
[im 14/30  brain]
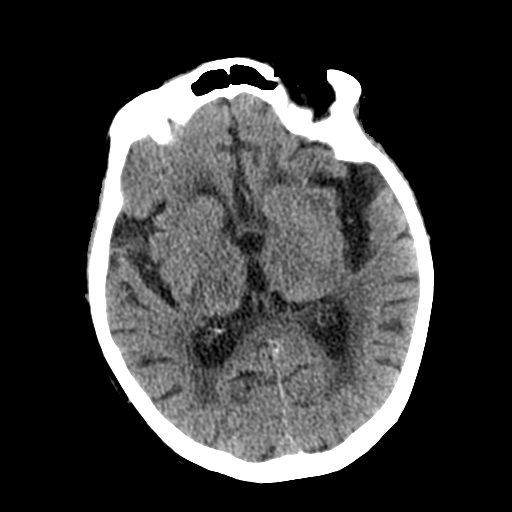
[im 14/30  bone]
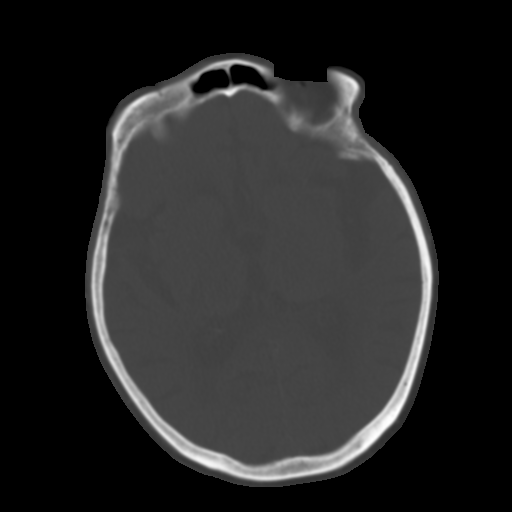
[im 17/30  brain]
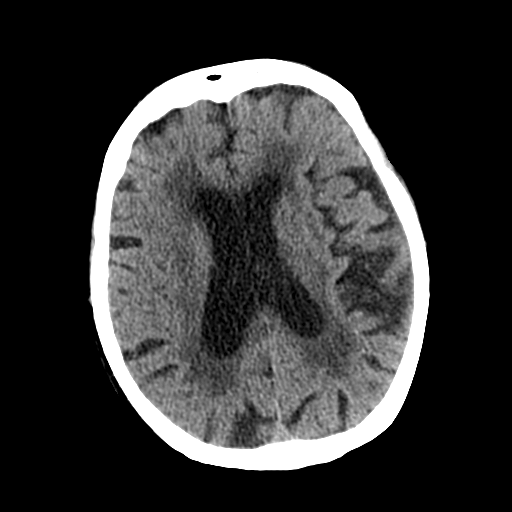
[im 20/30  brain]
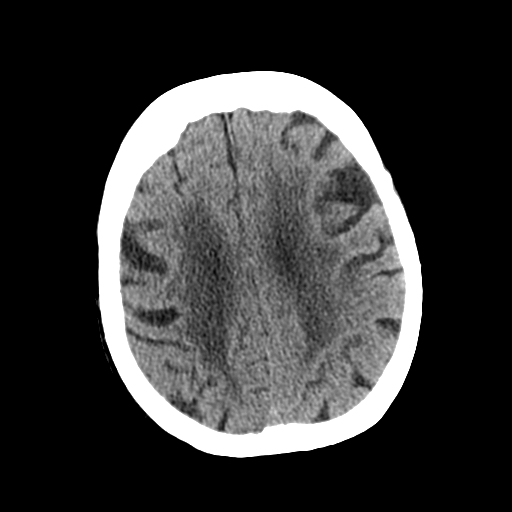
[im 23/30  brain]
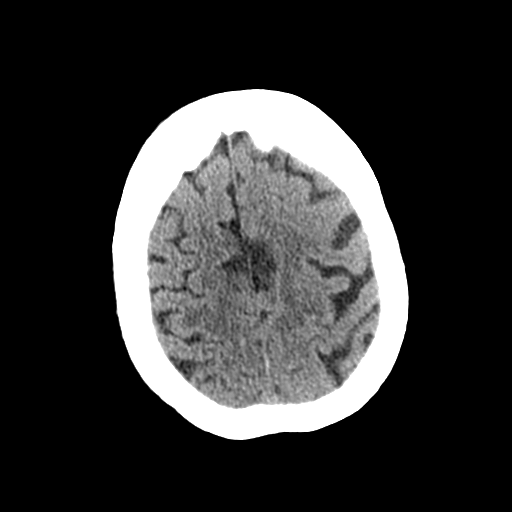
[im 25/30  brain]
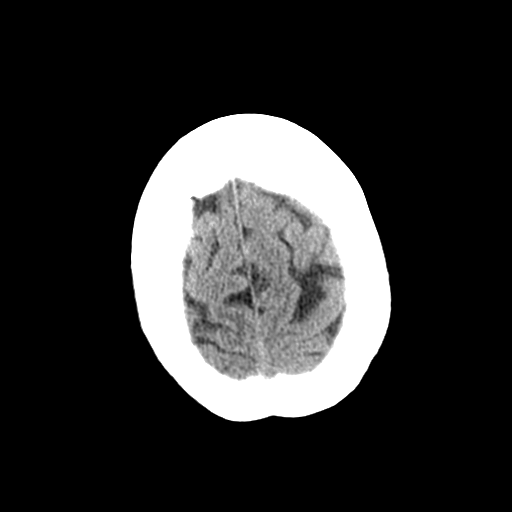
[im 25/30  bone]
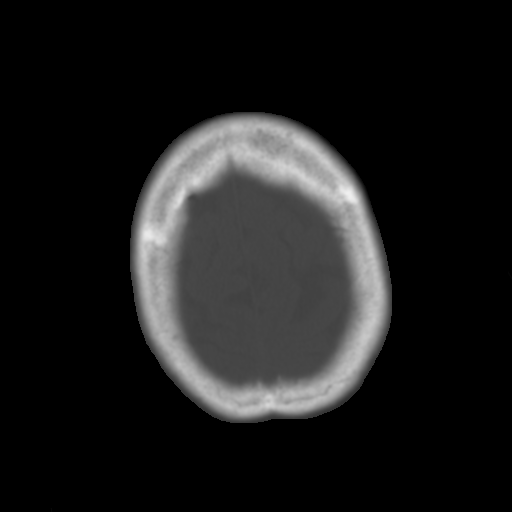
[im 28/30  brain]
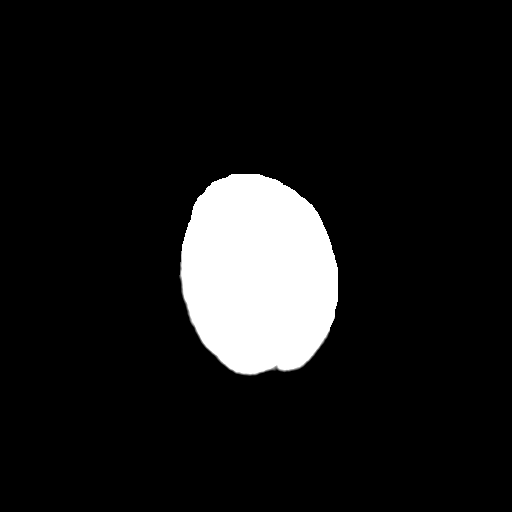

[Series 4: coronal soft · coronal · 0.33mm/px · 3 of 66 slices shown]
[im 22/66  brain]
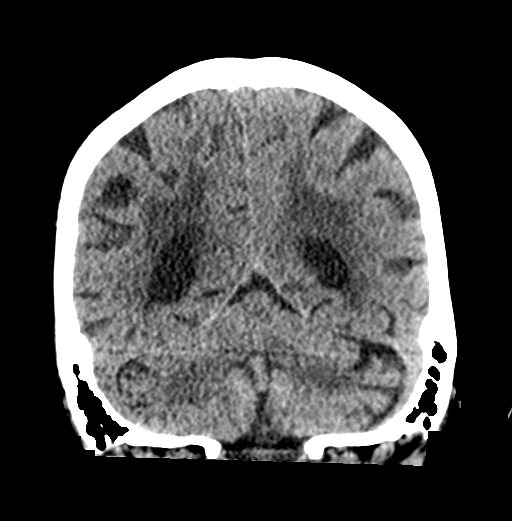
[im 29/66  brain]
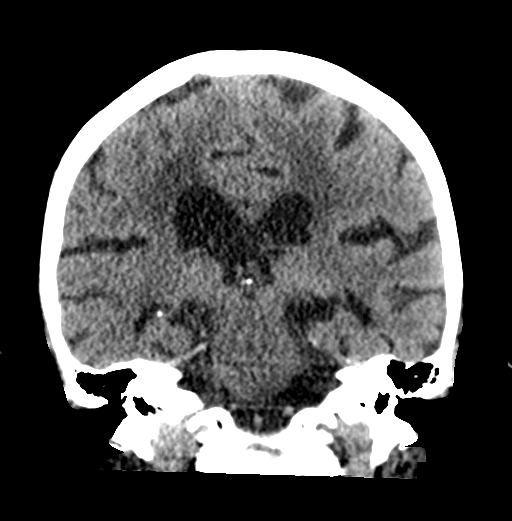
[im 37/66  brain]
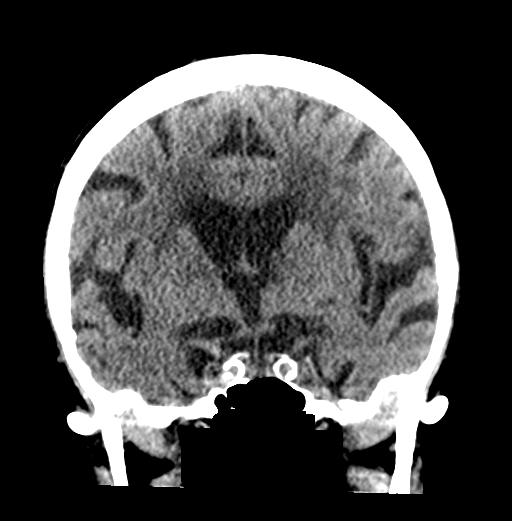

[Series 5: sagittal soft · sagittal · 0.33mm/px · 3 of 52 slices shown]
[im 18/52  brain]
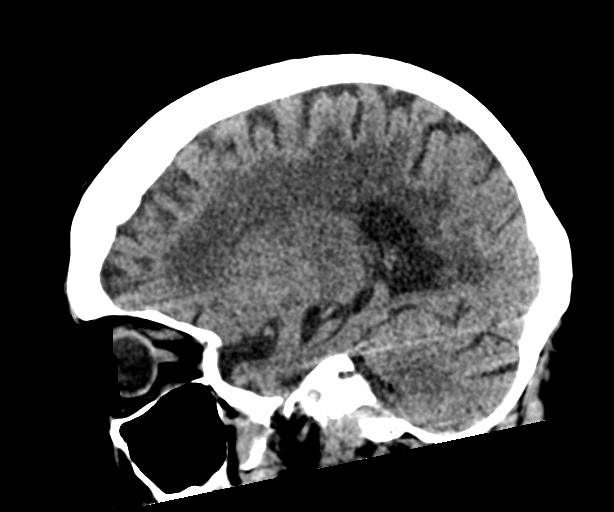
[im 26/52  brain]
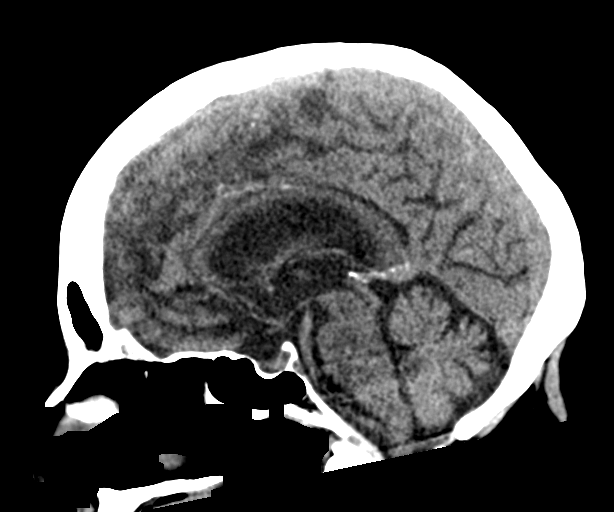
[im 35/52  brain]
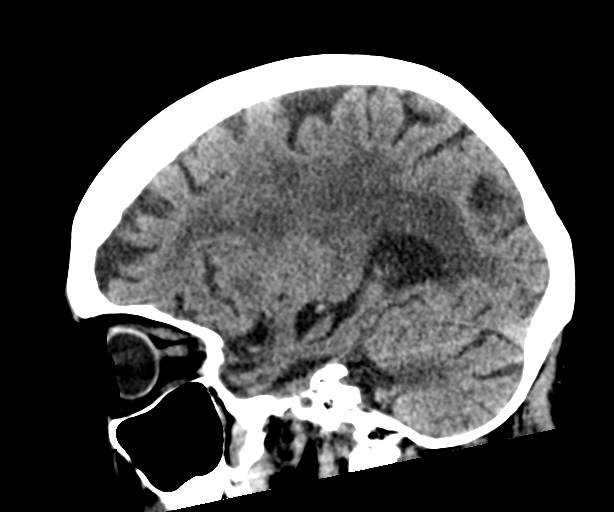

[16 of 47 positions shown; findings below may reference images not displayed]

FINDINGS: Brain: There are confluent hypodensities within the periventricular
white matter consistent with age-indeterminate small vessel ischemic
changes. There is diffuse cerebral atrophy, age-appropriate. No
signs of acute infarct or hemorrhage. Lateral ventricles and midline
structures are unremarkable. No acute extra-axial fluid collections.
No mass effect.

Vascular: No hyperdense vessel or unexpected calcification.

Skull: Normal. Negative for fracture or focal lesion.

Sinuses/Orbits: No acute finding.

Other: None
IMPRESSION: 1. Age indeterminate small-vessel ischemic changes throughout the
periventricular white matter, favor chronic.
2. No acute intracranial process.

## 2022-01-13 DEATH — deceased
